# Patient Record
Sex: Male | Born: 1970 | Race: White | Hispanic: No | Marital: Married | Smoking: Never smoker
Health system: Southern US, Community
[De-identification: ages and names within clinical notes are randomized; demographics above are authoritative.]

## PROBLEM LIST (undated history)

## (undated) DIAGNOSIS — Z789 Other specified health status: Secondary | ICD-10-CM

## (undated) HISTORY — DX: Other specified health status: Z78.9

## (undated) HISTORY — PX: NO PAST SURGERIES: SHX2092

---

## 2019-02-18 ENCOUNTER — Encounter: Payer: Self-pay | Admitting: Sports Medicine

## 2019-02-18 ENCOUNTER — Ambulatory Visit (INDEPENDENT_AMBULATORY_CARE_PROVIDER_SITE_OTHER): Payer: Managed Care, Other (non HMO)

## 2019-02-18 ENCOUNTER — Other Ambulatory Visit: Payer: Self-pay

## 2019-02-18 ENCOUNTER — Ambulatory Visit (INDEPENDENT_AMBULATORY_CARE_PROVIDER_SITE_OTHER): Payer: Managed Care, Other (non HMO) | Admitting: Sports Medicine

## 2019-02-18 DIAGNOSIS — M79672 Pain in left foot: Secondary | ICD-10-CM

## 2019-02-18 MED ORDER — PREDNISONE 50 MG PO TABS: ORAL_TABLET | ORAL | 0 refills | Status: DC

## 2019-02-18 NOTE — Progress Notes (Signed)
Subjective:    CC: Left heel pain  HPI:  This is a pleasant 48 year old male, on and off for several years he has had flares of pain in his left and right heels, he will get significant swelling, pain, redness, this occasionally occurs after eating a good amount of meat, having some beers.  It last for several weeks to months and then resolved.  More recently he had some beers and some meat, subsequently developed pain and swelling in his left heel, redness, localized without radiation.  No recent trauma.  I reviewed the past medical history, family history, social history, surgical history, and allergies today and no changes were needed.  Please see the problem list section below in epic for further details.  Past Medical History: Past Medical History:  Diagnosis Date  . No pertinent past medical history    Past Surgical History: Past Surgical History:  Procedure Laterality Date  . NO PAST SURGERIES     Social History: Social History   Socioeconomic History  . Marital status: Not on file    Spouse name: Not on file  . Number of children: Not on file  . Years of education: Not on file  . Highest education level: Not on file  Occupational History  . Not on file  Social Needs  . Financial resource strain: Not on file  . Food insecurity    Worry: Not on file    Inability: Not on file  . Transportation needs    Medical: Not on file    Non-medical: Not on file  Tobacco Use  . Smoking status: Never Smoker  . Smokeless tobacco: Current User    Types: Chew  Substance and Sexual Activity  . Alcohol use: Yes  . Drug use: Never  . Sexual activity: Not on file  Lifestyle  . Physical activity    Days per week: Not on file    Minutes per session: Not on file  . Stress: Not on file  Relationships  . Social Musician on phone: Not on file    Gets together: Not on file    Attends religious service: Not on file    Active member of club or organization: Not on file     Attends meetings of clubs or organizations: Not on file    Relationship status: Not on file  Other Topics Concern  . Not on file  Social History Narrative  . Not on file   Family History: No family history on file. Allergies: No Known Allergies Medications: See med rec.  Review of Systems: No headache, visual changes, nausea, vomiting, diarrhea, constipation, dizziness, abdominal pain, skin rash, fevers, chills, night sweats, swollen lymph nodes, weight loss, chest pain, body aches, joint swelling, muscle aches, shortness of breath, mood changes, visual or auditory hallucinations.  Objective:    General: Well Developed, well nourished, and in no acute distress.  Neuro: Alert and oriented x3, extra-ocular muscles intact, sensation grossly intact.  HEENT: Normocephalic, atraumatic, pupils equal round reactive to light, neck supple, no masses, no lymphadenopathy, thyroid nonpalpable.  Skin: Warm and dry, no rashes noted.  Cardiac: Regular rate and rhythm, no murmurs rubs or gallops.  Respiratory: Clear to auscultation bilaterally. Not using accessory muscles, speaking in full sentences.  Abdominal: Soft, nontender, nondistended, positive bowel sounds, no masses, no organomegaly.  Left foot: No visible erythema or swelling. Range of motion is full in all directions. Strength is 5/5 in all directions. No hallux valgus. No pes  cavus or pes planus. No abnormal callus noted. No pain over the navicular prominence, or base of fifth metatarsal. No tenderness to palpation of the calcaneal insertion of plantar fascia. No pain at the Achilles insertion. Tender, slightly reddish and swollen calcaneal bursa No pain of the retrocalcaneal bursa. No tenderness to palpation over the tarsals, metatarsals, or phalanges. No hallux rigidus or limitus. No tenderness palpation over interphalangeal joints. No pain with compression of the metatarsal heads. Neurovascularly intact distally.   Impression and Recommendations:    The patient was counselled, risk factors were discussed, anticipatory guidance given.  Inflammatory heel pain, left This looks like a calcaneal bursitis, he also has these in flares which can be seen in crystalline arthropathy such as gout. Adding 5 days of prednisone, heel lifts, serum uric acid levels, CMP. Low purine diet. X-rays, rehab exercises, return to see me in 1 month, if uric acid levels are normal and if this does not resolve the prednisone we will consider treatment is insertional Achilles tendinosis with topical nitroglycerin and 6 to 8 weeks of eccentric rehab. Return to see me in a month.   ___________________________________________ Gwen Her. Dianah Field, M.D., ABFM., CAQSM. Primary Care and Sports Medicine [facility]  Adjunct Professor of [place] [employer] [facility] [employer] of Medicine

## 2019-02-18 NOTE — Assessment & Plan Note (Addendum)
This looks like a calcaneal bursitis, he also has these in flares which can be seen in crystalline arthropathy such as gout. Adding 5 days of prednisone, heel lifts, serum uric acid levels, CMP. Low purine diet. X-rays, rehab exercises, return to see me in 1 month, if uric acid levels are normal and if this does not resolve the prednisone we will consider treatment is insertional Achilles tendinosis with topical nitroglycerin and 6 to 8 weeks of eccentric rehab. Return to see me in a month.

## 2019-02-18 NOTE — Patient Instructions (Signed)

## 2019-02-19 LAB — COMPLETE METABOLIC PANEL WITH GFR
AG Ratio: 2 (calc) (ref 1.0–2.5)
ALT: 32 U/L (ref 9–46)
AST: 25 U/L (ref 10–40)
Albumin: 4.9 g/dL (ref 3.6–5.1)
Alkaline phosphatase (APISO): 68 U/L (ref 36–130)
BUN/Creatinine Ratio: 15 (calc) (ref 6–22)
BUN: 22 mg/dL (ref 7–25)
CO2: 28 mmol/L (ref 20–32)
Calcium: 10.2 mg/dL (ref 8.6–10.3)
Chloride: 102 mmol/L (ref 98–110)
Creat: 1.47 mg/dL — ABNORMAL HIGH (ref 0.60–1.35)
GFR, Est African American: 64 mL/min/{1.73_m2} (ref >=60)
GFR, Est Non African American: 55 mL/min/{1.73_m2} — ABNORMAL LOW (ref >=60)
Globulin: 2.5 g/dL (calc) (ref 1.9–3.7)
Glucose, Bld: 94 mg/dL (ref 65–99)
Potassium: 4.1 mmol/L (ref 3.5–5.3)
Sodium: 139 mmol/L (ref 135–146)
Total Bilirubin: 0.5 mg/dL (ref 0.2–1.2)
Total Protein: 7.4 g/dL (ref 6.1–8.1)

## 2019-02-19 LAB — CBC
HCT: 46.9 % (ref 38.5–50.0)
Hemoglobin: 15.9 g/dL (ref 13.2–17.1)
MCH: 30.2 pg (ref 27.0–33.0)
MCHC: 33.9 g/dL (ref 32.0–36.0)
MCV: 89 fL (ref 80.0–100.0)
MPV: 10.9 fL (ref 7.5–12.5)
Platelets: 297 10*3/uL (ref 140–400)
RBC: 5.27 10*6/uL (ref 4.20–5.80)
RDW: 12.8 % (ref 11.0–15.0)
WBC: 9.3 10*3/uL (ref 3.8–10.8)

## 2019-02-19 LAB — URIC ACID: Uric Acid, Serum: 6.7 mg/dL (ref 4.0–8.0)

## 2019-03-17 ENCOUNTER — Ambulatory Visit (INDEPENDENT_AMBULATORY_CARE_PROVIDER_SITE_OTHER): Payer: Managed Care, Other (non HMO) | Admitting: Sports Medicine

## 2019-03-17 ENCOUNTER — Other Ambulatory Visit: Payer: Self-pay

## 2019-03-17 ENCOUNTER — Encounter: Payer: Self-pay | Admitting: Sports Medicine

## 2019-03-17 DIAGNOSIS — M79672 Pain in left foot: Secondary | ICD-10-CM | POA: Diagnosis not present

## 2019-03-17 NOTE — Assessment & Plan Note (Signed)
Initial suspected calcaneal bursitis. Improved considerably with prednisone, heel lifts. Uric acid levels were 6.7. Low purine diet, we will do this for at least another month before rechecking uric acid levels. Adding eccentric Achilles rehab to the current exercises. Return to see me as needed. Ultimately if this has another flare we will add allopurinol to drive uric acid levels down to below 5 or 6.

## 2019-03-17 NOTE — Patient Instructions (Signed)
Begin with easy walking, heel, toe and backwards  Do calf raises on a step:  First lower and then raise on 1 foot  If this is painful, lower on 1 foot, do the heel raise on both feet Begin with 3 sets of 10 repetitions  Increase by 5 repetitions every 3 days  Goal is 3 sets of 30 repetitions  Do with both straight knee and knee at 20 degrees of flexion  If pain persists, once you can do 3 sets of 30 without weight, add backpack with 5 lbs.  Increase by 5 lbs per week to max of 30 lbs for 3 sets of 15   

## 2019-03-17 NOTE — Progress Notes (Signed)
  Subjective:    CC: Follow-up  HPI: Richard Peck returns, Richard Peck had inflammatory left heel pain, initially thought to be a calcaneal bursitis flare, Richard Peck responded well to prednisone initially most part pain-free  I reviewed the past medical history, family history, social history, surgical history, and allergies today and no changes were needed.  Please see the problem list section below in epic for further details.  Past Medical History: Past Medical History:  Diagnosis Date  . No pertinent past medical history    Past Surgical History: Past Surgical History:  Procedure Laterality Date  . NO PAST SURGERIES     Social History: Social History   Socioeconomic History  . Marital status: Married    Spouse name: Not on file  . Number of children: Not on file  . Years of education: Not on file  . Highest education level: Not on file  Occupational History  . Not on file  Social Needs  . Financial resource strain: Not on file  . Food insecurity    Worry: Not on file    Inability: Not on file  . Transportation needs    Medical: Not on file    Non-medical: Not on file  Tobacco Use  . Smoking status: Never Smoker  . Smokeless tobacco: Current User    Types: Chew  Substance and Sexual Activity  . Alcohol use: Yes  . Drug use: Never  . Sexual activity: Not on file  Lifestyle  . Physical activity    Days per week: Not on file    Minutes per session: Not on file  . Stress: Not on file  Relationships  . Social Herbalist on phone: Not on file    Gets together: Not on file    Attends religious service: Not on file    Active member of club or organization: Not on file    Attends meetings of clubs or organizations: Not on file    Relationship status: Not on file  Other Topics Concern  . Not on file  Social History Narrative  . Not on file   Family History: No family history on file. Allergies: No Known Allergies Medications: See med rec.  Review of Systems: No  fevers, chills, night sweats, weight loss, chest pain, or shortness of breath.   Objective:    General: Well Developed, well nourished, and in no acute distress.  Neuro: Alert and oriented x3, extra-ocular muscles intact, sensation grossly intact.  HEENT: Normocephalic, atraumatic, pupils equal round reactive to light, neck supple, no masses, no lymphadenopathy, thyroid nonpalpable.  Skin: Warm and dry, no rashes. Cardiac: Regular rate and rhythm, no murmurs rubs or gallops, no lower extremity edema.  Respiratory: Clear to auscultation bilaterally. Not using accessory muscles, speaking in full sentences.  Impression and Recommendations:    Inflammatory heel pain, left Initial suspected calcaneal bursitis. Improved considerably with prednisone, heel lifts. Uric acid levels were 6.7. Low purine diet, we will do this for at least another month before rechecking uric acid levels. Adding eccentric Achilles rehab to the current exercises. Return to see me as needed. Ultimately if this has another flare we will add allopurinol to drive uric acid levels down to below 5 or 6.   ___________________________________________ Richard Peck. Dianah Field, M.D., ABFM., CAQSM. Primary Care and Sports Medicine [facility]  Adjunct Professor of [place] of [employer] [employer] of Medicine

## 2021-05-20 IMAGING — DX DG OS CALCIS 2+V*L*
2 series · 2 of 2 positions shown · non-contrast
Comparison: None.

CLINICAL DATA: Pain at the Achilles insertion

EXAM:
LEFT OS CALCIS - 2+ VIEW

[calcaneus axial]
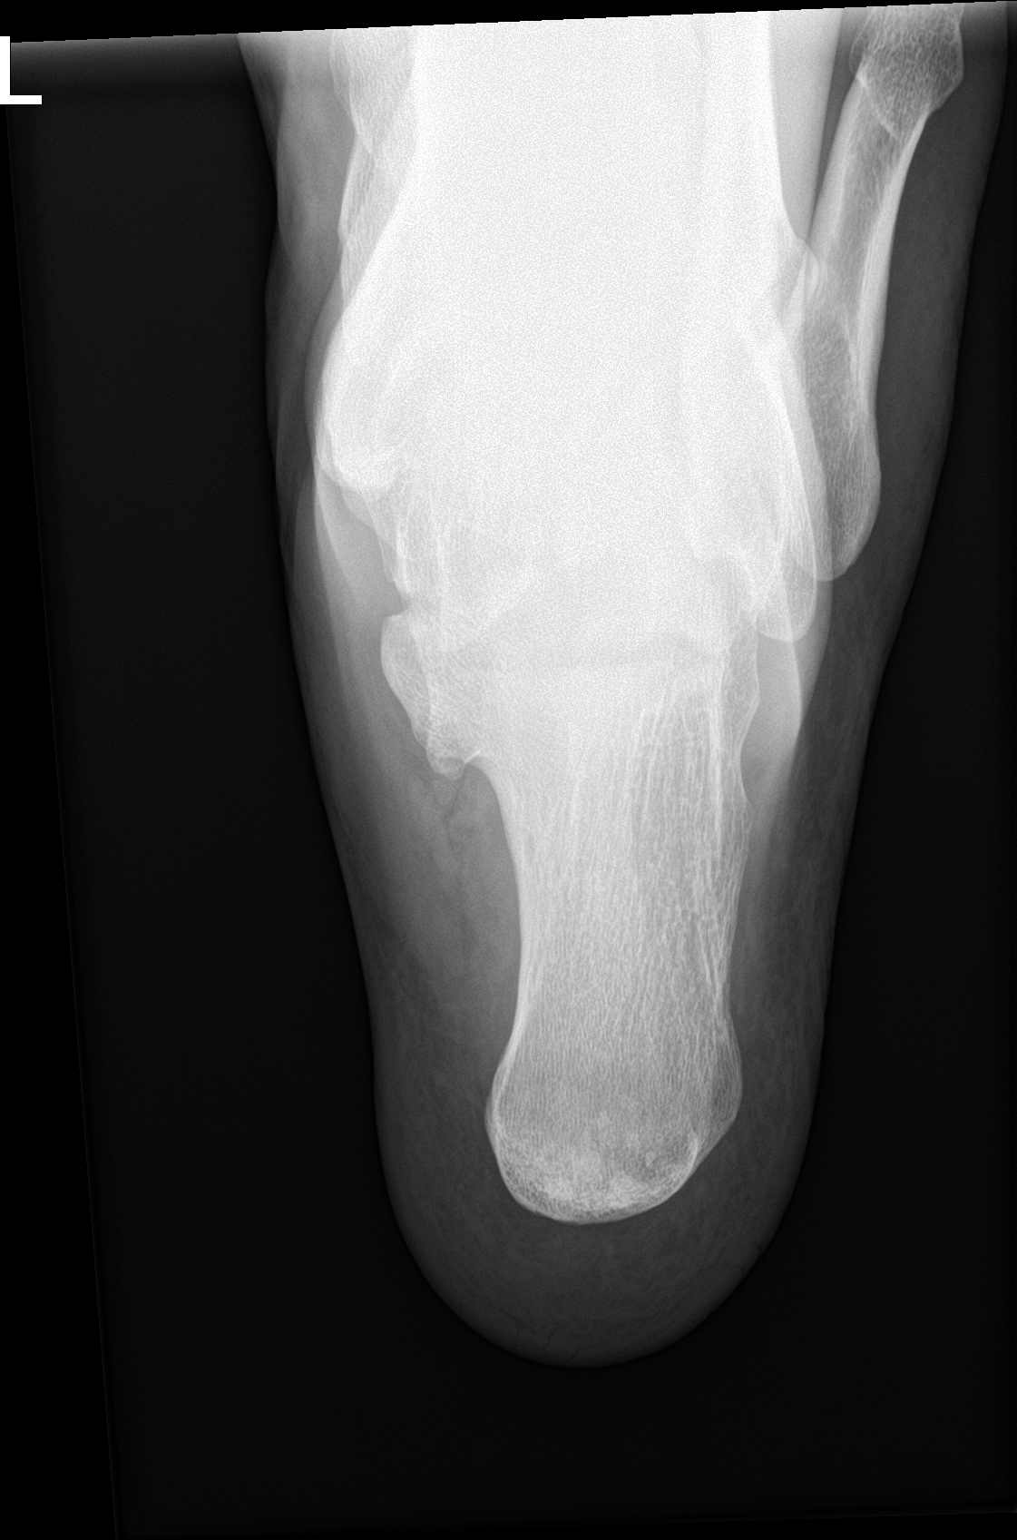

[calcaneus lat]
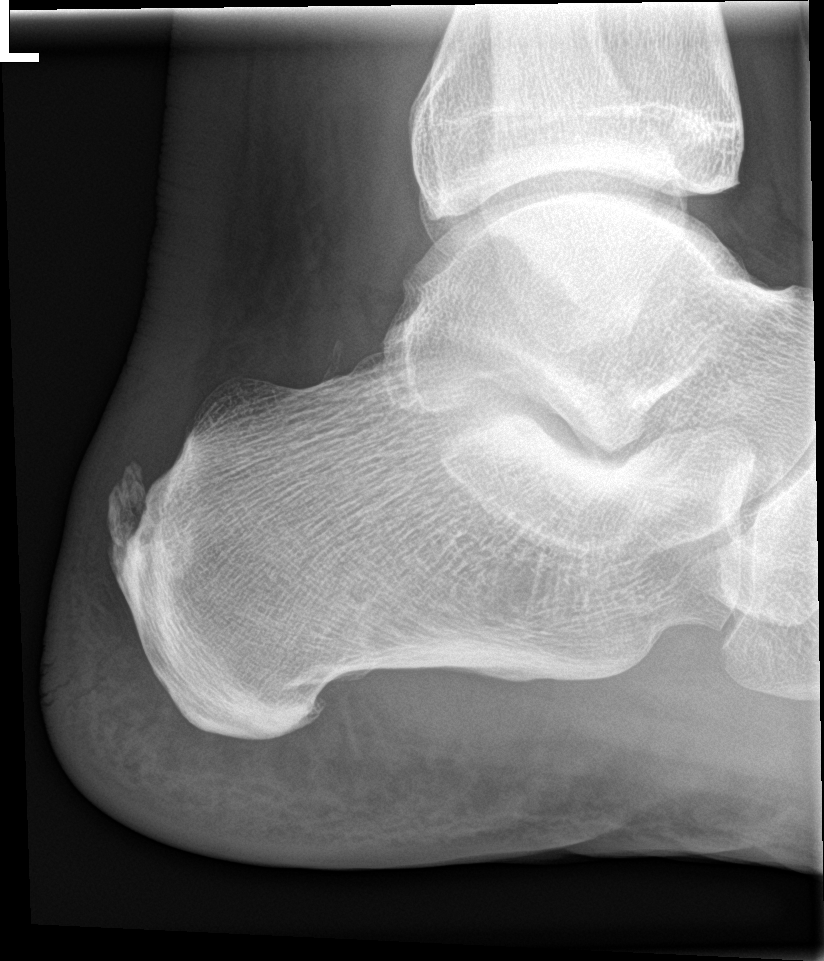

[2 of 2 positions shown; findings below may reference images not displayed]

FINDINGS: Posterior calcaneal spur at the Achilles insertion. No acute bony
abnormality. Specifically, no fracture, subluxation, or dislocation.
IMPRESSION: Posterior calcaneal spur.  No acute bony abnormality.
